# Patient Record
Sex: Male | Born: 1940 | Race: Black or African American | Hispanic: No | Marital: Married | Smoking: Never smoker
Health system: Southern US, Community
[De-identification: ages and names within clinical notes are randomized; demographics above are authoritative.]

## PROBLEM LIST (undated history)

## (undated) ENCOUNTER — Emergency Department: Discharge status: Absent without leave | Payer: TRICARE For Life (TFL)

## (undated) DIAGNOSIS — M109 Gout, unspecified: Secondary | ICD-10-CM

## (undated) DIAGNOSIS — E119 Type 2 diabetes mellitus without complications: Secondary | ICD-10-CM

## (undated) DIAGNOSIS — N400 Enlarged prostate without lower urinary tract symptoms: Secondary | ICD-10-CM

## (undated) DIAGNOSIS — I10 Essential (primary) hypertension: Secondary | ICD-10-CM

## (undated) DIAGNOSIS — E7800 Pure hypercholesterolemia, unspecified: Secondary | ICD-10-CM

## (undated) HISTORY — PX: JOINT REPLACEMENT: SHX530

## (undated) HISTORY — DX: Gout, unspecified: M10.9

## (undated) HISTORY — DX: Essential (primary) hypertension: I10

## (undated) HISTORY — DX: Type 2 diabetes mellitus without complications: E11.9

## (undated) HISTORY — DX: Pure hypercholesterolemia, unspecified: E78.00

## (undated) HISTORY — DX: Benign prostatic hyperplasia without lower urinary tract symptoms: N40.0

---

## 2008-12-26 ENCOUNTER — Emergency Department (HOSPITAL_COMMUNITY): Admission: EM | Admit: 2008-12-26 | Discharge: 2008-12-26 | Payer: Self-pay | Admitting: Emergency Medicine

## 2010-04-02 IMAGING — US US ART/VEN ABD/PELV/SCROTUM DOPPLER COMPLETE
1 series · 14 of 25 positions shown · non-contrast
Comparison: None

CLINICAL DATA: Right testicular pain for 1 week,   hypertension,
diabetes

SCROTAL ULTRASOUND
DOPPLER ULTRASOUND OF THE TESTICLES
TECHNIQUE: Complete ultrasound examination of the testicles,
epididymis, and other scrotal structures was performed.  Color and
spectral Doppler ultrasound were also utilized to evaluate blood
flow to the testicles.

[Series 1: us art/ven abd/pelv/scrotum doppler complete · 0.07mm/px · 14 of 68 slices shown]
[im 1/68]
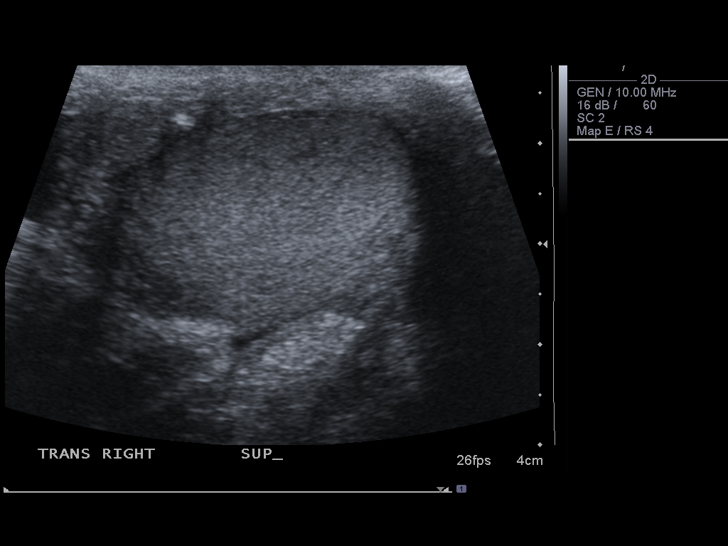
[im 6/68]
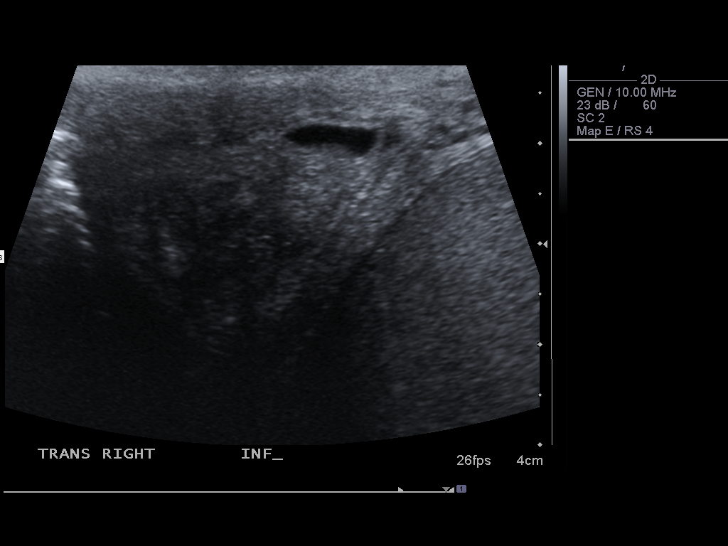
[im 12/68]
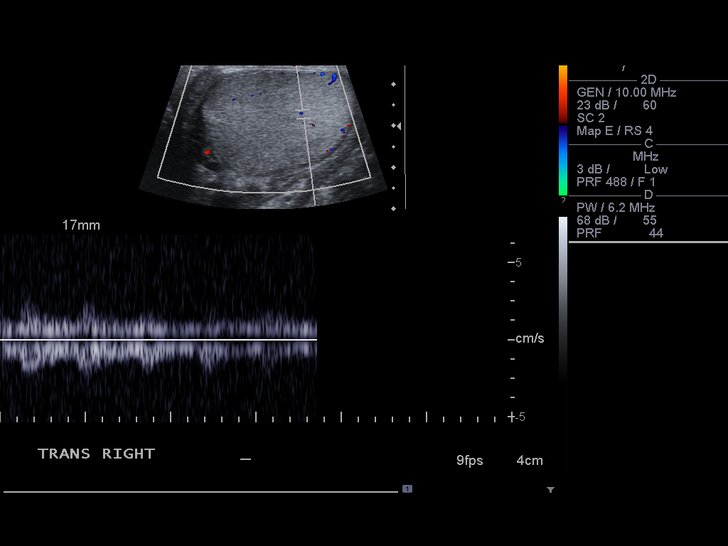
[im 17/68]
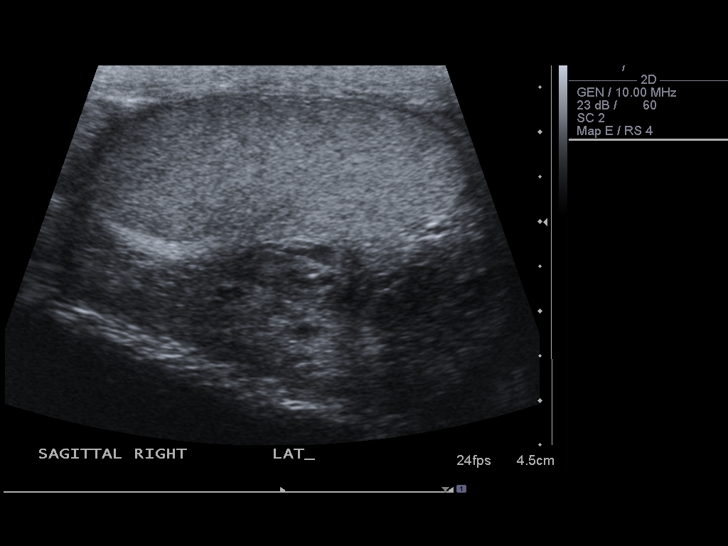
[im 23/68]
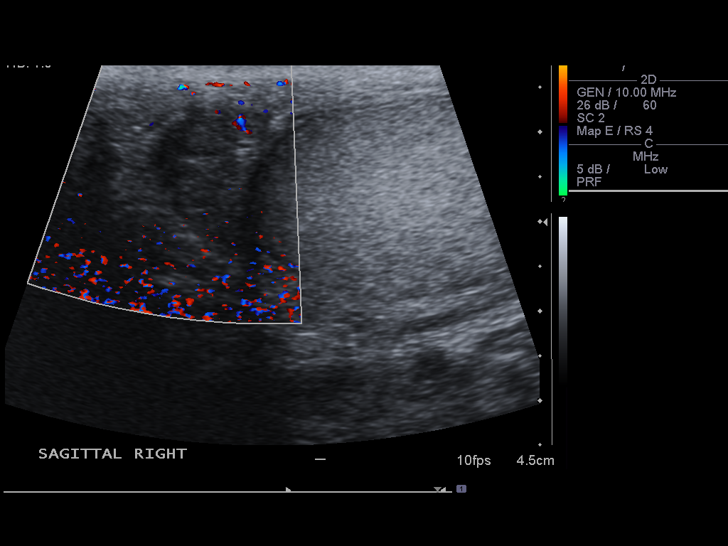
[im 26/68]
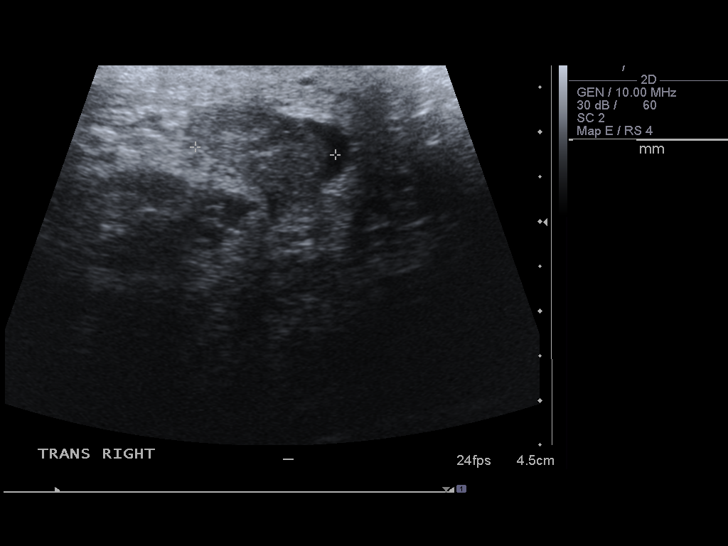
[im 31/68]
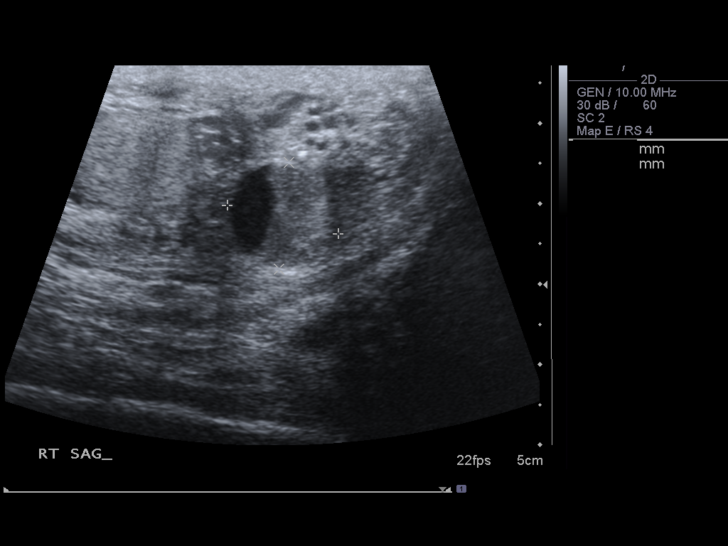
[im 37/68]
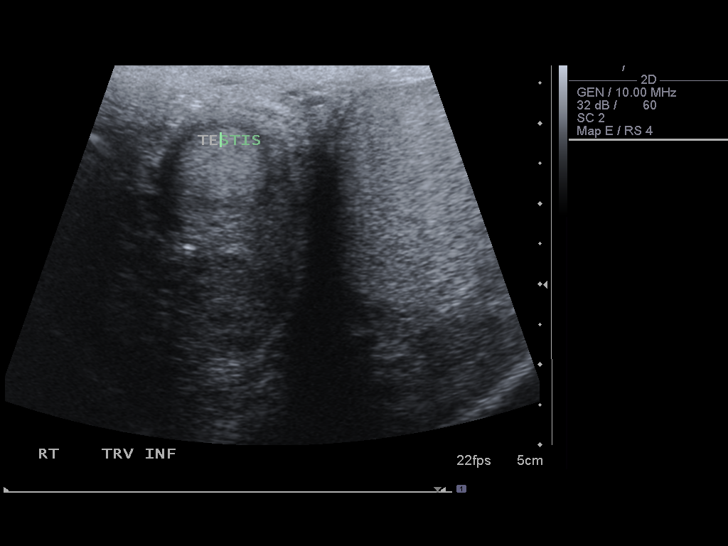
[im 42/68]
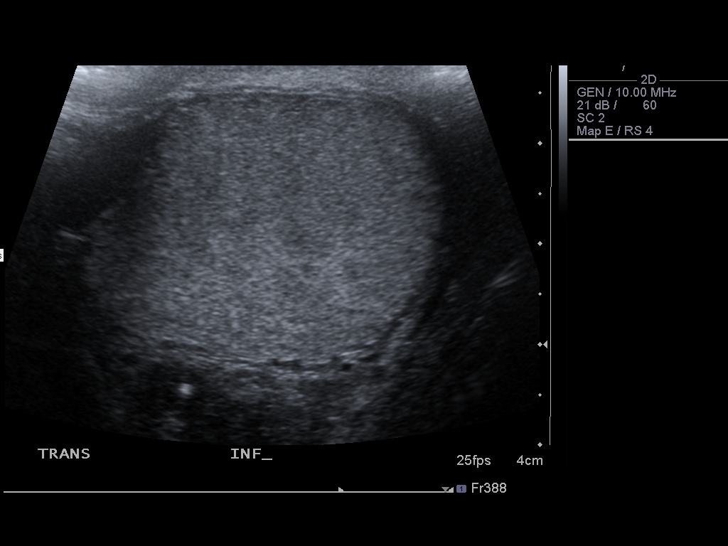
[im 45/68]
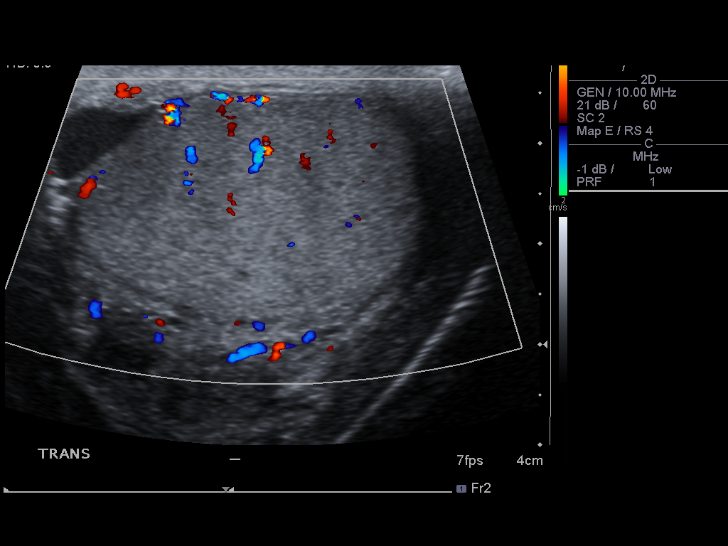
[im 51/68]
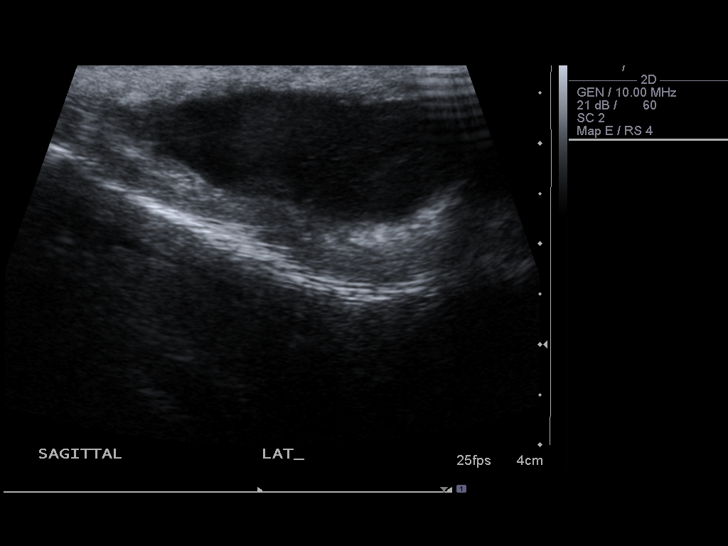
[im 56/68]
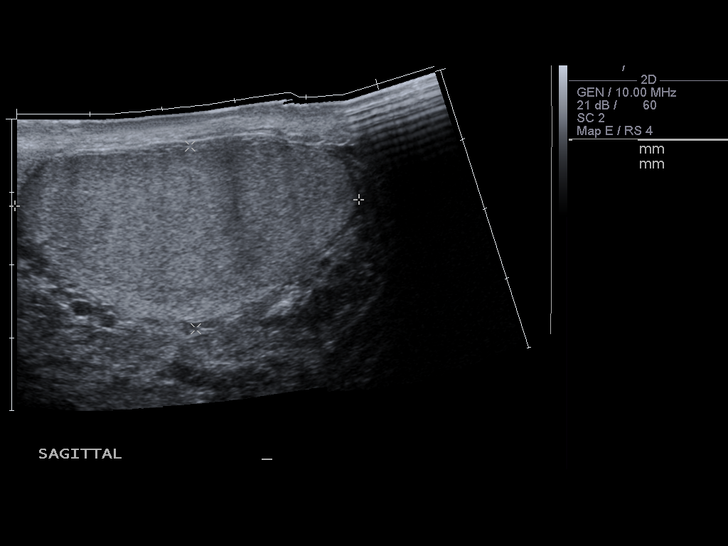
[im 62/68]
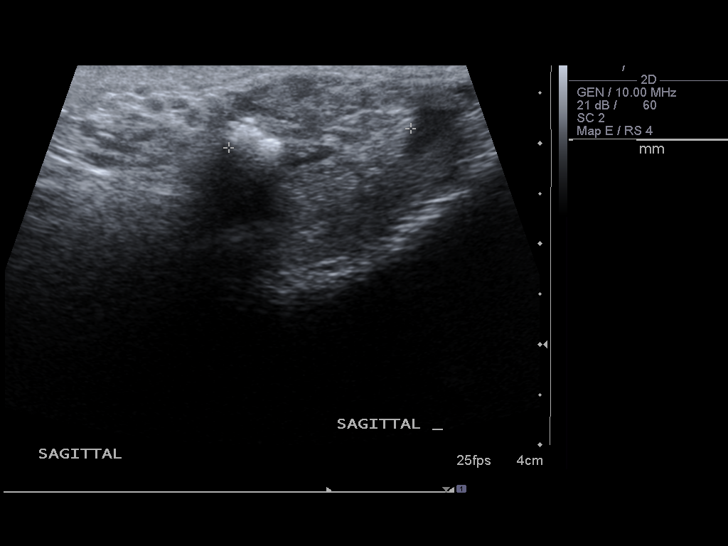
[im 68/68]
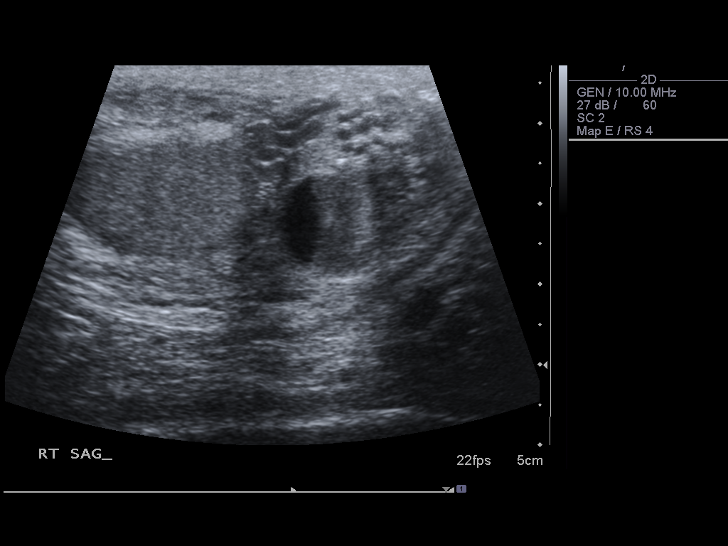

[14 of 25 positions shown; findings below may reference images not displayed]

FINDINGS: The right testicle measures 25 x 37 x 53 mm.  Normal
echotexture.  Normal color Doppler signal.  Arterial and venous
waveforms are recorded.  The epididymis is prominent. No hyperemia.
There is a 13 x 14 mm complex cyst in the epididymal tail, at the
inferior margin of the testicle.  No hydrocele.

Left testicle measures 25 x 37 x 47 mm, homogeneous in echotexture
with normal color Doppler Doppler signal return.  Arterial venous
waveforms are recorded.  Left epididymis prominent with focal areas
of calcification in the epididymal head and tail. No hyperemia.
There is a small hydrocele.  IMPRESSION:

1.  Normal bilateral testes.
2.  Complex 14 mm right epididymal cyst.
3.  Small left hydrocele.

## 2010-12-05 LAB — URINALYSIS, ROUTINE W REFLEX MICROSCOPIC
Bilirubin Urine: NEGATIVE
Glucose, UA: NEGATIVE mg/dL
Hgb urine dipstick: NEGATIVE
Ketones, ur: NEGATIVE mg/dL
Protein, ur: NEGATIVE mg/dL
Urobilinogen, UA: 1 mg/dL (ref 0.0–1.0)

## 2010-12-05 LAB — URINE CULTURE
Colony Count: NO GROWTH
Culture: NO GROWTH

## 2010-12-05 LAB — GLUCOSE, CAPILLARY: Glucose-Capillary: 221 mg/dL — ABNORMAL HIGH (ref 70–99)

## 2010-12-05 LAB — URINE MICROSCOPIC-ADD ON

## 2022-03-10 ENCOUNTER — Emergency Department (INDEPENDENT_AMBULATORY_CARE_PROVIDER_SITE_OTHER): Payer: Medicare Other

## 2022-03-10 ENCOUNTER — Other Ambulatory Visit: Payer: Self-pay

## 2022-03-10 ENCOUNTER — Emergency Department
Admission: EM | Admit: 2022-03-10 | Discharge: 2022-03-10 | Disposition: A | Discharge status: Home / Self Care | Payer: TRICARE For Life (TFL) | Attending: Family Medicine | Admitting: Family Medicine

## 2022-03-10 DIAGNOSIS — S91331A Puncture wound without foreign body, right foot, initial encounter: Secondary | ICD-10-CM | POA: Diagnosis not present

## 2022-03-10 DIAGNOSIS — Z23 Encounter for immunization: Secondary | ICD-10-CM

## 2022-03-10 MED ORDER — TETANUS-DIPHTH-ACELL PERTUSSIS 5-2.5-18.5 LF-MCG/0.5 IM SUSY
0.5000 mL | PREFILLED_SYRINGE | Freq: Once | INTRAMUSCULAR | Status: AC
  Administered 2022-03-10: 0.5 mL via INTRAMUSCULAR

## 2022-03-10 MED ORDER — AMOXICILLIN-POT CLAVULANATE 875-125 MG PO TABS: 1.0000 | ORAL_TABLET | Freq: Two times a day (BID) | ORAL | 0 refills | Status: AC

## 2022-03-10 NOTE — Discharge Instructions (Addendum)
Advised/informed patient of right foot x-ray results with hard copy provided to patient.  Instructed patient to take medication as directed with food to completion.  Encouraged patient increase daily water intake while taking this medication.  Advised patient to keep right foot elevated slightly above right hip as much as possible for the next 2 to 3 days.  Advised if signs/symptoms (redness, swelling, pain) of infection evolve please follow-up with PCP or here for further evaluation.

## 2022-03-10 NOTE — ED Triage Notes (Signed)
Pt presents to Urgent Care with c/o R foot injury. States he stepped on a "metal spike that was sticking up from the ground" last night. Unknown date of last Tdap.

## 2022-03-10 NOTE — ED Provider Notes (Signed)
[facility]    CSN: [id number] Arrival date & time: 03/10/22  1108      History   Chief Complaint Chief Complaint  Patient presents with   Puncture Wound    HPI [facility] is a 81 y.o. male.   HPI Very pleasant 81 year old male presents with puncture wound of right foot.  Reports stepped on metal spike that was sticking up from the ground last night fully 6 PM while walking in his garden.  Patient reports wearing different shoe than he is today and pulled his foot out of the shoe as a piece of metal went completely through sole of shoe and sole of right foot to dorsum of right foot. Patient reports last Tdap is unknown.  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  Past Medical History:  Diagnosis Date   BPH (benign prostatic hyperplasia)    Diabetes mellitus without complication (HCC)    Gout    Hypercholesteremia    Hypertension     There are no problems to display for this patient.   Past Surgical History:  Procedure Laterality Date   JOINT REPLACEMENT         Home Medications    Prior to Admission medications   Medication Sig Start Date End Date Taking? Authorizing Provider  amoxicillin-clavulanate (AUGMENTIN) 875-125 MG tablet Take 1 tablet by mouth 2 (two) times daily for 10 days. 03/10/22 03/20/22 Yes Trevor Iha, FNP  atorvastatin (LIPITOR) 40 MG tablet 1 tab(s) orally once a day 07/08/19  Yes [provider]  insulin glargine (LANTUS) 100 UNIT/ML injection Inject into the skin. 09/04/19  Yes [provider]  metFORMIN (GLUCOPHAGE) 500 MG tablet 1 and 1/2 tabs orally 2 times a day 08/03/20  Yes [provider]  allopurinol (ZYLOPRIM) 100 MG tablet 2 tab(s) orally once a day    [provider]  Aspirin 81 MG CAPS 1 tab(s) orally once a day    [provider]  finasteride (PROSCAR) 5 MG tablet Take 5 mg by mouth daily. 01/14/22   [provider]  gabapentin (NEURONTIN) 800 MG  tablet Take by mouth.    [provider]  lisinopril (ZESTRIL) 40 MG tablet 1 tab(s) orally once a day    [provider]  metoprolol tartrate (LOPRESSOR) 25 MG tablet Take 25 mg by mouth 2 (two) times daily. 02/14/22   [provider]    Family History Family History  Problem Relation Age of Onset   Cancer Mother    Cancer Father     Social History Social History   Tobacco Use   Smoking status: Never   Smokeless tobacco: Never  Vaping Use   Vaping Use: Never used  Substance Use Topics   Alcohol use: Not Currently   Drug use: Not Currently     Allergies   Patient has no known allergies.   Review of Systems Review of Systems  Skin:  Positive for wound.  All other systems reviewed and are negative.    Physical Exam Triage Vital Signs ED Triage Vitals  Enc Vitals Group     BP --      Pulse --      Resp --      Temp --      Temp src --      SpO2 --      Weight 03/10/22 1132 275 lb (124.7 kg)     Height 03/10/22 1132 5\' 11"  (1.803 m)  Head Circumference --      Peak Flow --      Pain Score 03/10/22 1131 5     Pain Loc --      Pain Edu? --      Excl. in GC? --    No data found.  Updated Vital Signs BP 136/76 (BP Location: Right Arm)   Pulse 78   Temp 99.4 F (37.4 C) (Oral)   Resp 20   Ht 5\' 11"  (1.803 m)   Wt 275 lb (124.7 kg)   SpO2 95%   BMI 38.35 kg/m      Physical Exam Vitals and nursing note reviewed.  Constitutional:      General: He is not in acute distress.    Appearance: He is obese. He is not ill-appearing.  HENT:     Head: Normocephalic and atraumatic.     Mouth/Throat:     Mouth: Mucous membranes are moist.     Pharynx: Oropharynx is clear.  Eyes:     Extraocular Movements: Extraocular movements intact.     Conjunctiva/sclera: Conjunctivae normal.     Pupils: Pupils are equal, round, and reactive to light.  Cardiovascular:     Rate and Rhythm: Normal rate and regular rhythm.     Pulses: Normal  pulses.     Heart sounds: Normal heart sounds. No murmur heard. Pulmonary:     Effort: Pulmonary effort is normal.     Breath sounds: Normal breath sounds. No wheezing, rhonchi or rales.  Musculoskeletal:     Cervical back: Normal range of motion and neck supple.  Skin:    General: Skin is warm and dry.     Comments: Right foot (dorsum/plantar aspects): Small (<0.5 cm) none erythematous nonindurated puncture wound, through and through noted-the images inserted below  Neurological:     General: No focal deficit present.     Mental Status: He is alert and oriented to person, place, and time. Mental status is at baseline.         UC Treatments / Results  Labs (all labs ordered are listed, but only abnormal results are displayed) Labs Reviewed - No data to display  EKG   Radiology DG Foot Complete Right  Result Date: 03/10/2022 CLINICAL DATA:  Plantar first metatarsal region right foot puncture injury EXAM: RIGHT FOOT COMPLETE - 3+ VIEW COMPARISON:  None Available. FINDINGS: Scattered soft tissue gas throughout the dorsal right midfoot on the lateral view. No fracture or dislocation. Tiny plantar and Achilles right calcaneal spurs. Moderate dorsal talonavicular osteoarthritis. Moderate interphalangeal joint right first toe and first MTP joint osteoarthritis. No cortical erosions or periosteal reaction. No suspicious focal osseous lesions. No radiopaque foreign bodies. IMPRESSION: 1. Nonspecific scattered soft tissue gas throughout the dorsal right midfoot on the lateral view, potentially related to reported puncture injury. Correlate with directed clinical exam to exclude necrotizing fasciitis. 2. No radiopaque foreign bodies. 3. No acute osseous abnormality. 4. Moderate polyarticular osteoarthritis as detailed. Electronically Signed   By: 03/12/2022 M.D.   On: 03/10/2022 12:22    Procedures Procedures (including critical care time)  Medications Ordered in UC Medications  Tdap  (BOOSTRIX) injection 0.5 mL (0.5 mLs Intramuscular Given 03/10/22 1213)    Initial Impression / Assessment and Plan / UC Course  I have reviewed the triage vital signs and the nursing notes.  Pertinent labs & imaging results that were available during my care of the patient were reviewed by me and considered in my medical decision  making (see chart for details).     MDM: 1.  Puncture wound of right foot excluding toes with complication, initial encounter-x-ray of right foot reveals above.  Tdap given to patient prior to discharge today, Rx'd Augmentin.  Advised/informed patient of right foot x-ray results with hard copy provided to patient.  Instructed patient to take medication as directed with food to completion.  Encouraged patient increase daily water intake while taking this medication.  Advised patient to keep right foot elevated slightly above right hip as much as possible for the next 2 to 3 days.  Advised if signs/symptoms (redness, swelling, pain) of infection evolve please follow-up with PCP or here for further evaluation.  Discharged home, hemodynamically stable.  Final Clinical Impressions(s) / UC Diagnoses   Final diagnoses:  Puncture wound of right foot excluding toes with complication, initial encounter     Discharge Instructions      Advised/informed patient of right foot x-ray results with hard copy provided to patient.  Instructed patient to take medication as directed with food to completion.  Encouraged patient increase daily water intake while taking this medication.  Advised patient to keep right foot elevated slightly above right hip as much as possible for the next 2 to 3 days.  Advised if signs/symptoms (redness, swelling, pain) of infection evolve please follow-up with PCP or here for further evaluation.     ED Prescriptions     Medication Sig Dispense Auth. Provider   amoxicillin-clavulanate (AUGMENTIN) 875-125 MG tablet Take 1 tablet by mouth 2 (two) times  daily for 10 days. 20 tablet Trevor Iha, FNP      PDMP not reviewed this encounter.   Trevor Iha, FNP 03/10/22 1242

## 2022-07-27 DEATH — deceased
